# Patient Record
Sex: Male | Born: 2000 | Race: White | Hispanic: No | Marital: Single
Health system: Southern US, Community
[De-identification: ages and names within clinical notes are randomized; demographics above are authoritative.]

## PROBLEM LIST (undated history)

## (undated) DIAGNOSIS — E119 Type 2 diabetes mellitus without complications: Secondary | ICD-10-CM

## (undated) DIAGNOSIS — E1110 Type 2 diabetes mellitus with ketoacidosis without coma: Secondary | ICD-10-CM

---
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
## 2020-06-22 ENCOUNTER — Other Ambulatory Visit (INDEPENDENT_AMBULATORY_CARE_PROVIDER_SITE_OTHER): Payer: Self-pay | Admitting: Family
